# Patient Record
Sex: Female | Born: 2000 | Race: Black or African American | Hispanic: No | Marital: Single | Smoking: Current every day smoker
Health system: Southern US, Community
[De-identification: ages and names within clinical notes are randomized; demographics above are authoritative.]

---
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
## 2021-05-12 ENCOUNTER — Emergency Department: Payer: BC Managed Care – PPO

## 2021-05-12 ENCOUNTER — Emergency Department
Admission: EM | Admit: 2021-05-12 | Discharge: 2021-05-12 | Disposition: A | Discharge status: Home / Self Care | Payer: BC Managed Care – PPO | Attending: Emergency Medicine | Admitting: Emergency Medicine

## 2021-05-12 ENCOUNTER — Other Ambulatory Visit: Payer: Self-pay

## 2021-05-12 ENCOUNTER — Encounter: Payer: Self-pay | Admitting: Emergency Medicine

## 2021-05-12 DIAGNOSIS — N390 Urinary tract infection, site not specified: Secondary | ICD-10-CM | POA: Diagnosis not present

## 2021-05-12 DIAGNOSIS — R109 Unspecified abdominal pain: Secondary | ICD-10-CM | POA: Diagnosis present

## 2021-05-12 DIAGNOSIS — U071 COVID-19: Secondary | ICD-10-CM | POA: Diagnosis not present

## 2021-05-12 LAB — COMPREHENSIVE METABOLIC PANEL
ALT: 18 U/L (ref 0–44)
AST: 32 U/L (ref 15–41)
Albumin: 3.7 g/dL (ref 3.5–5.0)
Alkaline Phosphatase: 45 U/L (ref 38–126)
Anion gap: 8 (ref 5–15)
BUN: 7 mg/dL (ref 6–20)
CO2: 25 mmol/L (ref 22–32)
Calcium: 8.8 mg/dL — ABNORMAL LOW (ref 8.9–10.3)
Chloride: 103 mmol/L (ref 98–111)
Creatinine, Ser: 0.79 mg/dL (ref 0.44–1.00)
GFR, Estimated: >60 mL/min (ref >60)
Glucose, Bld: 96 mg/dL (ref 70–99)
Potassium: 4 mmol/L (ref 3.5–5.1)
Sodium: 136 mmol/L (ref 135–145)
Total Bilirubin: 1.1 mg/dL (ref 0.3–1.2)
Total Protein: 7.7 g/dL (ref 6.5–8.1)

## 2021-05-12 LAB — CBC
HCT: 38.5 % (ref 36.0–46.0)
Hemoglobin: 12.8 g/dL (ref 12.0–15.0)
MCH: 27.4 pg (ref 26.0–34.0)
MCHC: 33.2 g/dL (ref 30.0–36.0)
MCV: 82.4 fL (ref 80.0–100.0)
Platelets: 244 10*3/uL (ref 150–400)
RBC: 4.67 MIL/uL (ref 3.87–5.11)
RDW: 12.2 % (ref 11.5–15.5)
WBC: 6.7 10*3/uL (ref 4.0–10.5)
nRBC: 0 % (ref 0.0–0.2)

## 2021-05-12 LAB — URINALYSIS, COMPLETE (UACMP) WITH MICROSCOPIC
Bilirubin Urine: NEGATIVE
Glucose, UA: NEGATIVE mg/dL
Ketones, ur: 20 mg/dL — AB
Nitrite: NEGATIVE
Protein, ur: NEGATIVE mg/dL
Specific Gravity, Urine: 1.01 (ref 1.005–1.030)
pH: 6 (ref 5.0–8.0)

## 2021-05-12 LAB — LIPASE, BLOOD: Lipase: 23 U/L (ref 11–51)

## 2021-05-12 LAB — PREGNANCY, URINE: Preg Test, Ur: NEGATIVE

## 2021-05-12 MED ORDER — KETOROLAC TROMETHAMINE 30 MG/ML IJ SOLN
30.0000 mg | Freq: Once | INTRAMUSCULAR | Status: AC
  Administered 2021-05-12: 30 mg via INTRAVENOUS
  Filled 2021-05-12: qty 1

## 2021-05-12 MED ORDER — ONDANSETRON HCL 4 MG/2ML IJ SOLN
4.0000 mg | Freq: Once | INTRAMUSCULAR | Status: AC
  Administered 2021-05-12: 4 mg via INTRAVENOUS
  Filled 2021-05-12: qty 2

## 2021-05-12 MED ORDER — ONDANSETRON 4 MG PO TBDP: 4.0000 mg | ORAL_TABLET | Freq: Three times a day (TID) | ORAL | 0 refills | Status: DC | PRN

## 2021-05-12 MED ORDER — CEPHALEXIN 500 MG PO CAPS: 500.0000 mg | ORAL_CAPSULE | Freq: Three times a day (TID) | ORAL | 0 refills | Status: DC

## 2021-05-12 MED ORDER — CEPHALEXIN 500 MG PO CAPS
500.0000 mg | ORAL_CAPSULE | Freq: Once | ORAL | Status: AC
  Administered 2021-05-12: 500 mg via ORAL
  Filled 2021-05-12: qty 1

## 2021-05-12 MED ORDER — SODIUM CHLORIDE 0.9 % IV BOLUS
1000.0000 mL | Freq: Once | INTRAVENOUS | Status: AC
  Administered 2021-05-12: 1000 mL via INTRAVENOUS

## 2021-05-12 NOTE — ED Notes (Signed)
Pt assisted to the bathroom at this time.

## 2021-05-12 NOTE — ED Provider Notes (Signed)
[facility] Emergency Department Provider Note  Time seen: 9:29 AM  I have reviewed the triage vital signs and the nursing notes.   HISTORY  Chief Complaint Abdominal Pain   HPI Kelsey Moran is a 20 y.o. female with no significant past medical history presents to the emergency department for abdominal pain.  According to the patient she was diagnosed with COVID 3 days ago on Saturday.  States she has been having vomiting, cough congestion now over the past 2 days has been experiencing sharp right-sided abdominal pain.  States the pain is worse with any movement sneezing or coughing.   History reviewed. No pertinent past medical history.  There are no problems to display for this patient.   History reviewed. No pertinent surgical history.  Prior to Admission medications   Not on File    Allergies  Allergen Reactions   Shellfish Allergy Anaphylaxis    No family history on file.  Social History    Review of Systems Constitutional: Intermittent low-grade fever Cardiovascular: Negative for chest pain. Respiratory: Negative for shortness of breath.  Positive for cough. Gastrointestinal: Right flank pain.  Positive for nausea and vomiting. Genitourinary: Negative for urinary compaints Musculoskeletal: Negative for musculoskeletal complaints Neurological: Negative for headache All other ROS negative  ____________________________________________   PHYSICAL EXAM:  VITAL SIGNS: ED Triage Vitals  Enc Vitals Group     BP 05/12/21 0906 135/70     Pulse Rate 05/12/21 0906 90     Resp 05/12/21 0906 16     Temp 05/12/21 0906 99 F (37.2 C)     Temp Source 05/12/21 0906 Oral     SpO2 05/12/21 0906 100 %     Weight 05/12/21 0856 188 lb 0.8 oz (85.3 kg)     Height 05/12/21 0856 5\' 4"  (1.626 m)     Head Circumference --      Peak Flow --      Pain Score 05/12/21 0856 8     Pain Loc --      Pain Edu? --      Excl. in GC? --     Constitutional: Alert and oriented. Well appearing and in no distress. Eyes: Normal exam ENT      Head: Normocephalic and atraumatic.      Mouth/Throat: Mucous membranes are moist. Cardiovascular: Normal rate, regular rhythm.  Respiratory: Normal respiratory effort without tachypnea nor retractions. Breath sounds are clear  Gastrointestinal: Soft, mild right mid abdominal tenderness to palpation without rebound guarding or distention.  Moderate right CVA tenderness. Musculoskeletal: Nontender with normal range of motion in all extremities.  Neurologic:  Normal speech and language. No gross focal neurologic deficits  Skin:  Skin is warm, dry and intact.  Psychiatric: Mood and affect are normal.   ____________________________________________   RADIOLOGY  CT is negative for acute abnormality.  ____________________________________________   INITIAL IMPRESSION / ASSESSMENT AND PLAN / ED COURSE  Pertinent labs & imaging results that were available during my care of the patient were reviewed by me and considered in my medical decision making (see chart for details).   Patient presents emergency department for right flank pain nausea vomiting cough diagnosed with COVID 3 days ago.  Overall patient appears well, reassuring physical exam, reassuring vitals.  We will treat with Toradol Zofran fluids.  We will check labs and proceed with a CT renal scan to further evaluate.  Patient agreeable to plan of care.  CT scans negative for acute abnormality.  Urinalysis  does show a large amount of leukocytes with rare bacteria.  We will cover with Keflex as a precaution as well as Zofran and have the patient follow-up with her doctor.  Patient agreeable with plan of care.  Kelsey Moran was evaluated in Emergency Department on 05/12/2021 for the symptoms described in the history of present illness. She was evaluated in the context of the global COVID-19 pandemic, which necessitated consideration  that the patient might be at risk for infection with the SARS-CoV-2 virus that causes COVID-19. Institutional protocols and algorithms that pertain to the evaluation of patients at risk for COVID-19 are in a state of rapid change based on information released by regulatory bodies including the CDC and federal and state organizations. These policies and algorithms were followed during the patient's care in the ED.  ____________________________________________   FINAL CLINICAL IMPRESSION(S) / ED DIAGNOSES  Urinary tract infection COVID   Minna Antis, MD 05/12/21 1325

## 2021-05-12 NOTE — ED Notes (Signed)
Pt stating cannot provided urine sample at this time but states she is not pregnant

## 2021-05-12 NOTE — ED Triage Notes (Signed)
C/O RLQ abdominal pain  since Sunday.  Also vomiting.  Tested COVID + on Sunday.

## 2022-05-12 IMAGING — CT CT RENAL STONE PROTOCOL
2 of 4 series · 17 of 46 positions shown, 19 images · non-contrast
Comparison: None.

CLINICAL DATA: Right flank pain, right lower quadrant abdominal
pain, kidney stones suspected, COVID positive

EXAM:
CT ABDOMEN AND PELVIS WITHOUT CONTRAST
TECHNIQUE: Multidetector CT imaging of the abdomen and pelvis was performed
following the standard protocol without IV contrast.

[Series 2: stone full standard · axial · 0.67mm/px · z∈[-374,+66]mm · 14 of 96 slices shown, 16 images]
[im 4/96  soft-tissue]
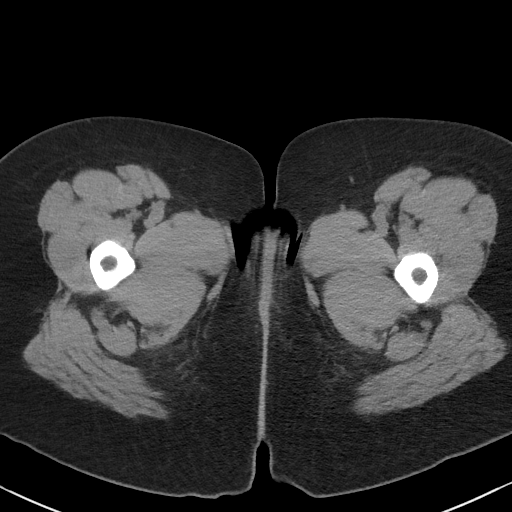
[im 4/96  bone]
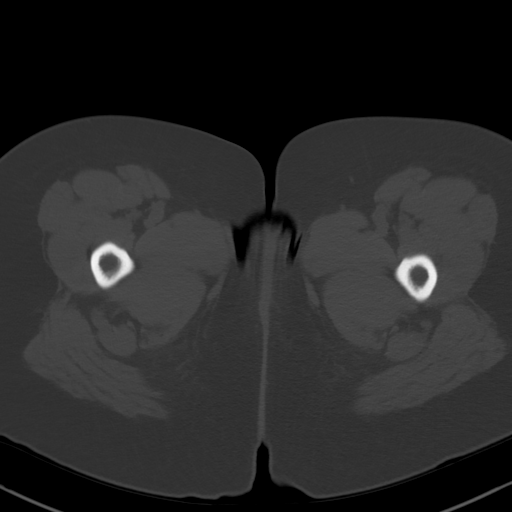
[im 12/96  soft-tissue]
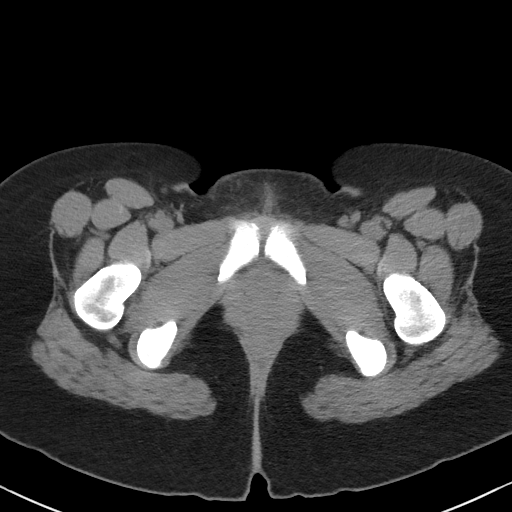
[im 20/96  soft-tissue]
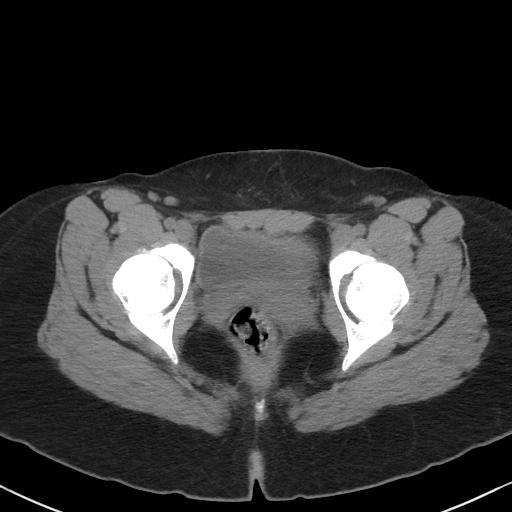
[im 27/96  soft-tissue]
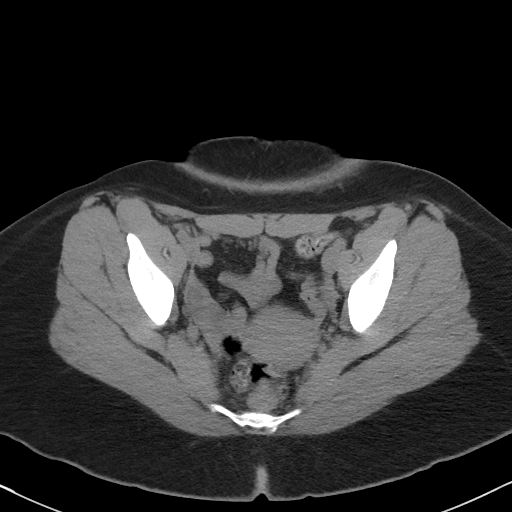
[im 31/96  soft-tissue]
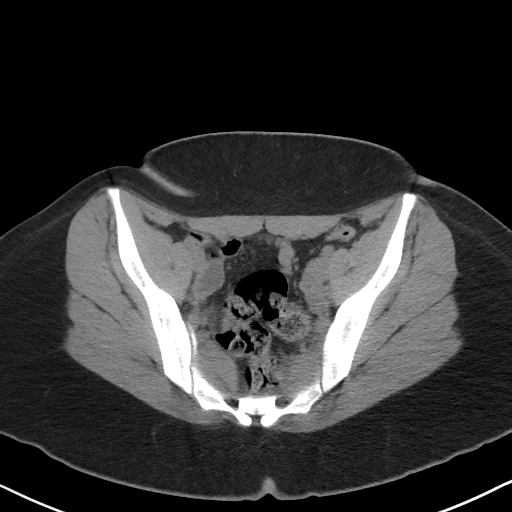
[im 39/96  soft-tissue]
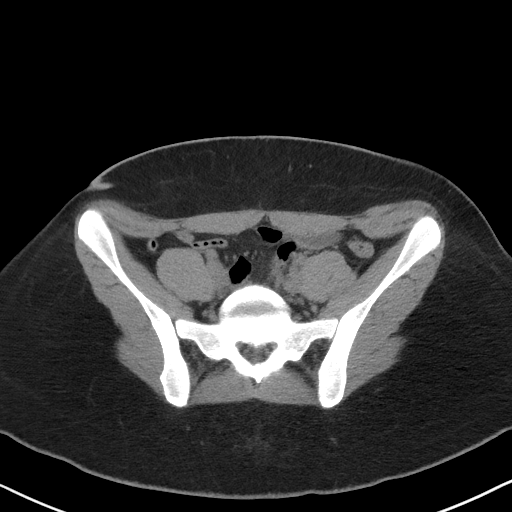
[im 46/96  soft-tissue]
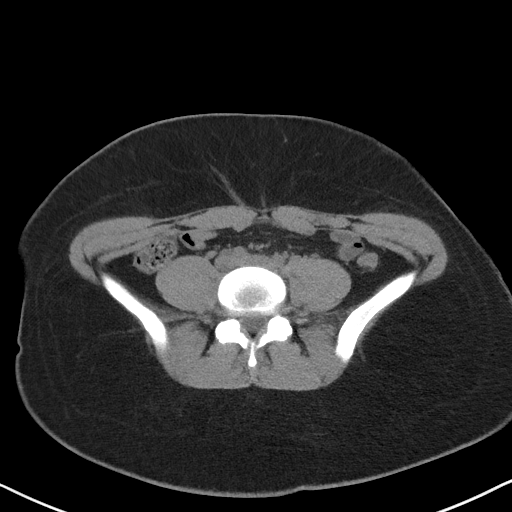
[im 50/96  soft-tissue]
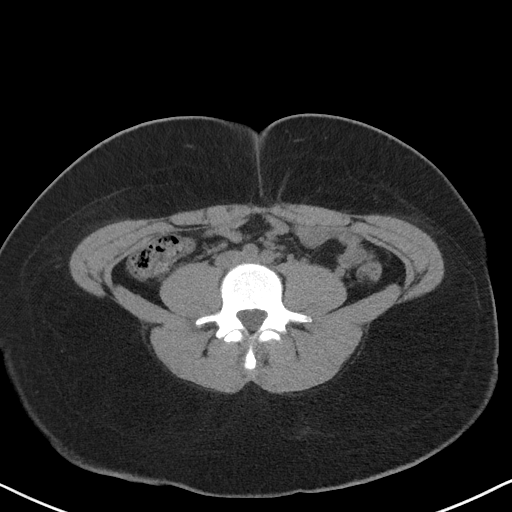
[im 58/96  soft-tissue]
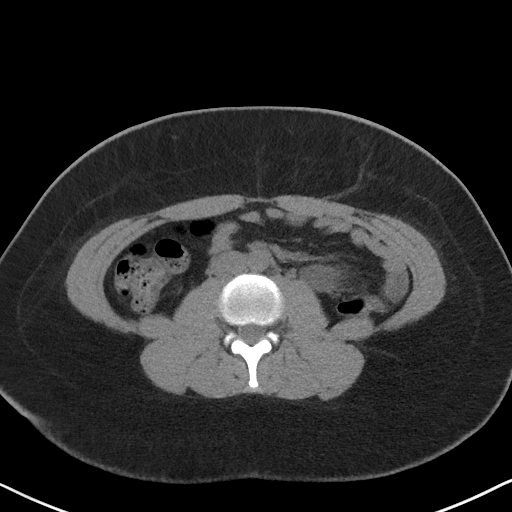
[im 58/96  bone]
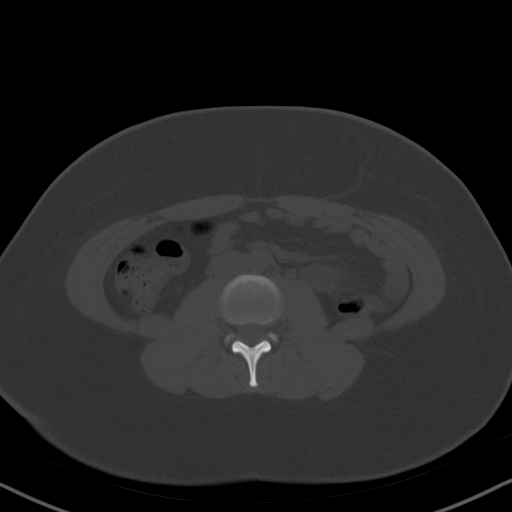
[im 65/96  soft-tissue]
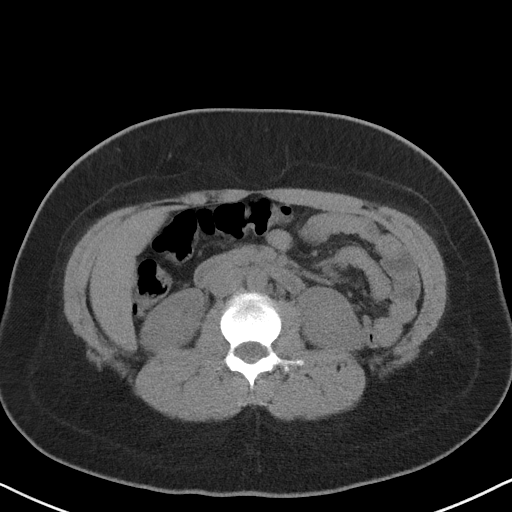
[im 73/96  soft-tissue]
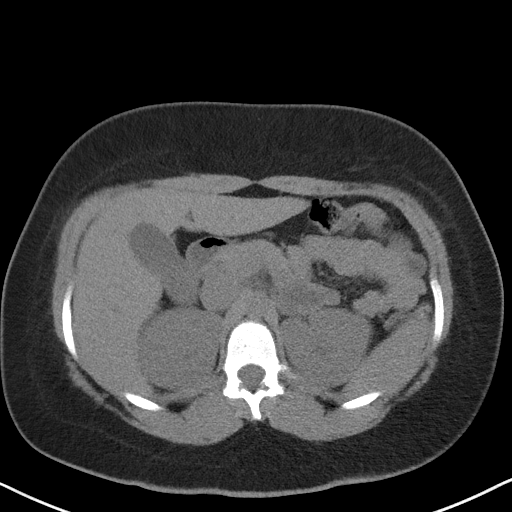
[im 77/96  soft-tissue]
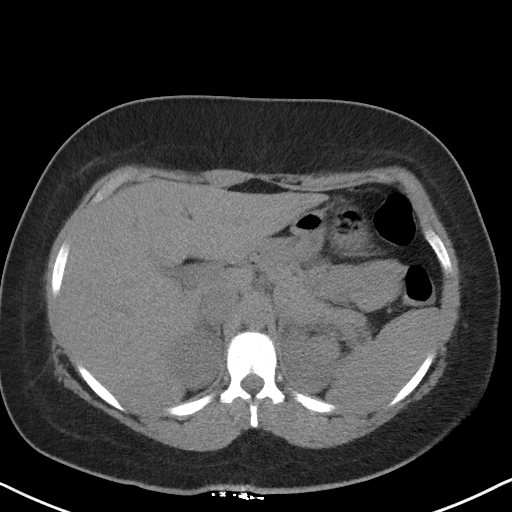
[im 84/96  soft-tissue]
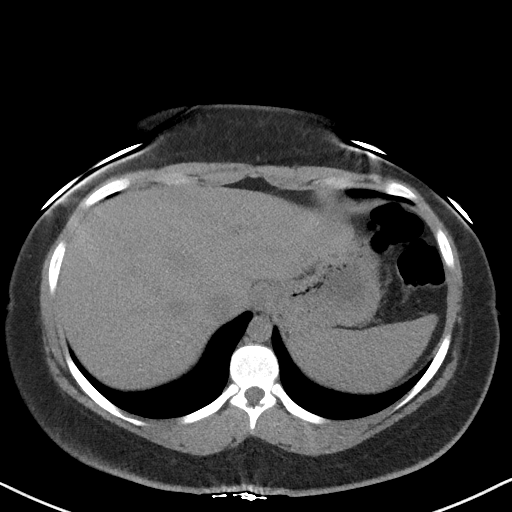
[im 92/96  soft-tissue]
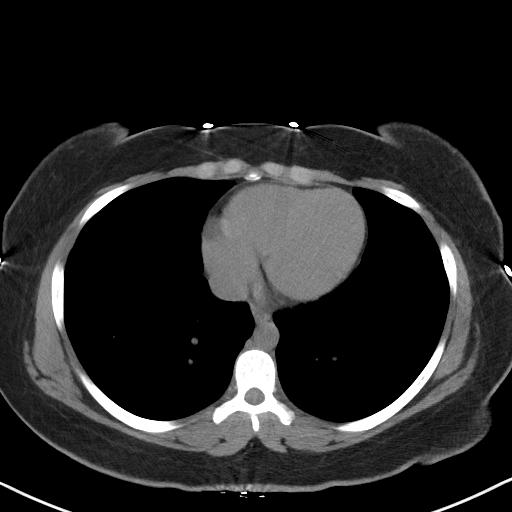

[Series 5: coronal · coronal · 0.90mm/px · 3 of 138 slices shown]
[im 46/138  soft-tissue]
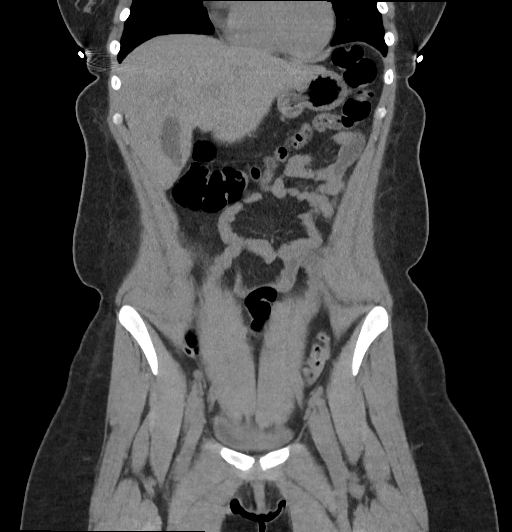
[im 61/138  soft-tissue]
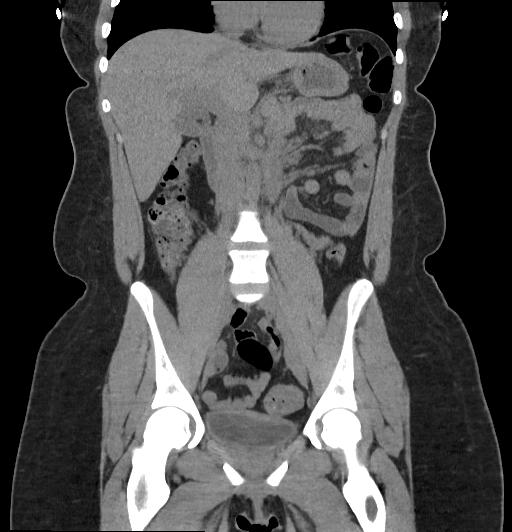
[im 77/138  soft-tissue]
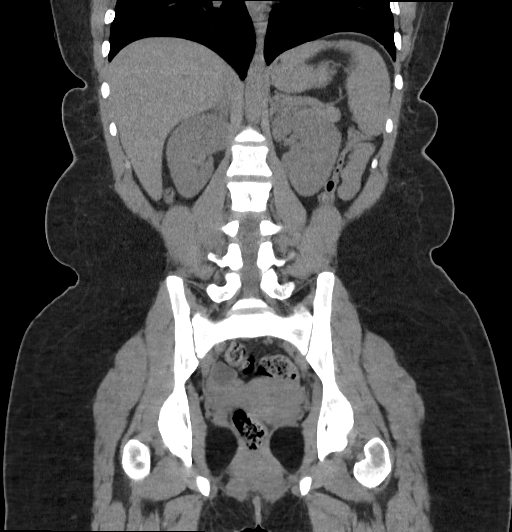

[17 of 46 positions shown; findings below may reference images not displayed]

FINDINGS: Lower chest: No acute abnormality.

Hepatobiliary: No solid liver abnormality is seen. No gallstones,
gallbladder wall thickening, or biliary dilatation.

Pancreas: Unremarkable. No pancreatic ductal dilatation or
surrounding inflammatory changes.

Spleen: Normal in size without significant abnormality.

Adrenals/Urinary Tract: Adrenal glands are unremarkable. Kidneys are
normal, without renal calculi, solid lesion, or hydronephrosis.
Bladder is unremarkable.

Stomach/Bowel: Stomach is within normal limits. Appendix appears
normal. No evidence of bowel wall thickening, distention, or
inflammatory changes.

Vascular/Lymphatic: No significant vascular findings are present. No
enlarged abdominal or pelvic lymph nodes.

Reproductive: No mass or other significant abnormality. Small cysts
or follicles of the right ovary.

Other: No abdominal wall hernia or abnormality. No abdominopelvic
ascites.

Musculoskeletal: No acute or significant osseous findings.
IMPRESSION: 1. No non-contrast CT findings of the abdomen or pelvis to explain
right flank pain. No evidence of urinary tract calculus or
hydronephrosis.

2.  Normal appendix.

## 2023-04-02 ENCOUNTER — Other Ambulatory Visit: Payer: Self-pay

## 2023-04-02 ENCOUNTER — Emergency Department
Admission: EM | Admit: 2023-04-02 | Discharge: 2023-04-02 | Disposition: A | Discharge status: Home / Self Care | Payer: Commercial Managed Care - PPO | Attending: Emergency Medicine | Admitting: Emergency Medicine

## 2023-04-02 ENCOUNTER — Emergency Department: Payer: Commercial Managed Care - PPO

## 2023-04-02 DIAGNOSIS — R6884 Jaw pain: Secondary | ICD-10-CM | POA: Diagnosis not present

## 2023-04-02 DIAGNOSIS — R21 Rash and other nonspecific skin eruption: Secondary | ICD-10-CM | POA: Insufficient documentation

## 2023-04-02 DIAGNOSIS — R599 Enlarged lymph nodes, unspecified: Secondary | ICD-10-CM | POA: Insufficient documentation

## 2023-04-02 LAB — URINALYSIS, ROUTINE W REFLEX MICROSCOPIC
Bilirubin Urine: NEGATIVE
Glucose, UA: NEGATIVE mg/dL
Hgb urine dipstick: NEGATIVE
Ketones, ur: NEGATIVE mg/dL
Leukocytes,Ua: NEGATIVE
Nitrite: NEGATIVE
Protein, ur: NEGATIVE mg/dL
Specific Gravity, Urine: 1.011 (ref 1.005–1.030)
pH: 6 (ref 5.0–8.0)

## 2023-04-02 LAB — COMPREHENSIVE METABOLIC PANEL
ALT: 15 U/L (ref 0–44)
AST: 26 U/L (ref 15–41)
Albumin: 4.1 g/dL (ref 3.5–5.0)
Alkaline Phosphatase: 57 U/L (ref 38–126)
Anion gap: 6 (ref 5–15)
BUN: 6 mg/dL (ref 6–20)
CO2: 24 mmol/L (ref 22–32)
Calcium: 9.1 mg/dL (ref 8.9–10.3)
Chloride: 107 mmol/L (ref 98–111)
Creatinine, Ser: 0.7 mg/dL (ref 0.44–1.00)
GFR, Estimated: >60 mL/min (ref >60)
Glucose, Bld: 88 mg/dL (ref 70–99)
Potassium: 3.9 mmol/L (ref 3.5–5.1)
Sodium: 137 mmol/L (ref 135–145)
Total Bilirubin: 0.6 mg/dL (ref 0.3–1.2)
Total Protein: 8.7 g/dL — ABNORMAL HIGH (ref 6.5–8.1)

## 2023-04-02 LAB — CBC WITH DIFFERENTIAL/PLATELET
Abs Immature Granulocytes: 0.01 10*3/uL (ref 0.00–0.07)
Basophils Absolute: 0 10*3/uL (ref 0.0–0.1)
Basophils Relative: 0 %
Eosinophils Absolute: 0.3 10*3/uL (ref 0.0–0.5)
Eosinophils Relative: 4 %
HCT: 39.1 % (ref 36.0–46.0)
Hemoglobin: 12.4 g/dL (ref 12.0–15.0)
Immature Granulocytes: 0 %
Lymphocytes Relative: 38 %
Lymphs Abs: 2.8 10*3/uL (ref 0.7–4.0)
MCH: 26 pg (ref 26.0–34.0)
MCHC: 31.7 g/dL (ref 30.0–36.0)
MCV: 82 fL (ref 80.0–100.0)
Monocytes Absolute: 0.6 10*3/uL (ref 0.1–1.0)
Monocytes Relative: 8 %
Neutro Abs: 3.6 10*3/uL (ref 1.7–7.7)
Neutrophils Relative %: 50 %
Platelets: 303 10*3/uL (ref 150–400)
RBC: 4.77 MIL/uL (ref 3.87–5.11)
RDW: 12.7 % (ref 11.5–15.5)
WBC: 7.3 10*3/uL (ref 4.0–10.5)
nRBC: 0 % (ref 0.0–0.2)

## 2023-04-02 LAB — PREGNANCY, URINE: Preg Test, Ur: NEGATIVE

## 2023-04-02 MED ORDER — DIPHENHYDRAMINE HCL 25 MG PO CAPS
25.0000 mg | ORAL_CAPSULE | Freq: Once | ORAL | Status: AC
  Administered 2023-04-02: 25 mg via ORAL
  Filled 2023-04-02: qty 1

## 2023-04-02 MED ORDER — DEXAMETHASONE SODIUM PHOSPHATE 10 MG/ML IJ SOLN
10.0000 mg | Freq: Once | INTRAMUSCULAR | Status: AC
  Administered 2023-04-02: 10 mg via INTRAMUSCULAR
  Filled 2023-04-02: qty 1

## 2023-04-02 NOTE — ED Provider Notes (Signed)
[facility] Provider Note    Event Date/Time   First MD Initiated Contact with Patient 04/02/23 1910     (approximate)   History   Dental Pain   HPI  Kelsey Moran is a 22 y.o. female with no PMH who presents with bilateral lower jaw pain and knots behind both the ears and on the back of her scalp, as well as a rash.  Patient first noticed the knots about a week ago.  She states that they are not painful.  Her rash began earlier today and is not itchy.  Patient denies any fevers, sore throat, cough, fatigue, weight loss, sick exposures, animal bites, tick bites, STI exposures, urinary symptoms.  She has not traveled anywhere recently.     Physical Exam   Triage Vital Signs: ED Triage Vitals  Enc Vitals Group     BP 04/02/23 1655 131/85     Pulse Rate 04/02/23 1655 (!) 57     Resp 04/02/23 1655 18     Temp 04/02/23 1655 98.1 F (36.7 C)     Temp Source 04/02/23 1655 Oral     SpO2 04/02/23 1655 96 %     Weight 04/02/23 1656 208 lb (94.3 kg)     Height 04/02/23 1656 5\' 4"  (1.626 m)     Head Circumference --      Peak Flow --      Pain Score 04/02/23 1700 3     Pain Loc --      Pain Edu? --      Excl. in GC? --     Most recent vital signs: Vitals:   04/02/23 2045 04/02/23 2204  BP: 121/73 125/86  Pulse: 79 85  Resp: 18 18  Temp: 98.7 F (37.1 C) 98.2 F (36.8 C)  SpO2: 100% 100%    General: Awake, no distress.  CV:  Good peripheral perfusion.  RRR, no murmurs rubs or gallops. Resp:  Normal effort.  CTAB. Abd:  No distention.  Macular rash across abdomen and back. Other:  Moist oral mucosa, no pharyngeal erythema, no tonsillar enlargement or erythema, palpable nontender posterior auricular, occipital and submandibular lymph nodes, EACs clear, TMs translucent.   ED Results / Procedures / Treatments   Labs (all labs ordered are listed, but only abnormal results are displayed) Labs Reviewed  COMPREHENSIVE METABOLIC PANEL -  Abnormal; Notable for the following components:      Result Value   Total Protein 8.7 (*)    All other components within normal limits  URINALYSIS, ROUTINE W REFLEX MICROSCOPIC - Abnormal; Notable for the following components:   Color, Urine YELLOW (*)    APPearance CLEAR (*)    All other components within normal limits  CBC WITH DIFFERENTIAL/PLATELET  PREGNANCY, URINE     RADIOLOGY  Chest x-ray obtained in the ED today.  I independently interpreted the images as well as reviewed the radiologist report.   PROCEDURES:  Critical Care performed: No  Procedures   MEDICATIONS ORDERED IN ED: Medications  diphenhydrAMINE (BENADRYL) capsule 25 mg (25 mg Oral Given 04/02/23 2100)  dexamethasone (DECADRON) injection 10 mg (10 mg Intramuscular Given 04/02/23 2159)     IMPRESSION / MDM / ASSESSMENT AND PLAN / ED COURSE  I reviewed the triage vital signs and the nursing notes.                              Differential diagnosis  includes, but is not limited to, TMJ pain, pulpitis, mastoiditis, pharyngitis, lymphoma, mono.  Patient's presentation is most consistent with acute complicated illness / injury requiring diagnostic workup.  Patient presented to the ED for evaluation of a knot under her ear that has been present for about a week.  She also had a macular rash that began today.  I gave her some Benadryl in an attempt to clear this.  This did not work so I gave her some dexamethasone before discharge.  On physical exam it became clear that the knots were swollen lymph nodes. Patient had stable vitals and was asymptomatic aside from the enlarged lymph nodes. I took an extensive history and was unable to identify any infectious reasons as to why the patient would have enlarged lymph nodes.  As a result of this I was concerned about lymphoma so I got basic blood work which was normal.  I also got a UA to look for a potential infectious source, which came back clean.  I got a pregnancy test  as patient had concern she may be pregnant, which was also negative.  Chest x-ray was obtained to look for a lung mass.  I independently reviewed the images as well as the radiologist report which was negative for any acute pathology as well as a mass.  I explained all of the results to the patient and her mother.  Given the negative workup and patients stable vital signs I believe she is appropriate for outpatient management.  I advised her to follow-up with her PCP in a couple weeks if she still noticing the enlarged lymph nodes.  I explained that if her lymph nodes remain enlarged she should have further workup but based on my evaluation so far there is nothing emergent that needs to be done at this point.  I also explained that she can see her PCP if the rash does not go away.  Patient and mother were relieved that there was nothing acute going on.  All questions were answered, patient was stable at discharge.     FINAL CLINICAL IMPRESSION(S) / ED DIAGNOSES   Final diagnoses:  Lymph node enlargement     Rx / DC Orders   ED Discharge Orders     None        Note:  This document was prepared using Dragon voice recognition software and may include unintentional dictation errors.   Cameron Ali, PA-C 04/03/23 0036    Sharman Cheek, MD 04/04/23 [phone]

## 2023-04-02 NOTE — ED Triage Notes (Signed)
Pt presents via POV c/o bilateral lower jaw pain and "knot" under ear. Reports has been going on for a week. Denies fever. Reports feels like she feels throbbing in left ear.

## 2023-04-04 ENCOUNTER — Encounter: Payer: Commercial Managed Care - PPO | Admitting: Internal Medicine

## 2023-04-13 ENCOUNTER — Encounter: Payer: Self-pay | Admitting: Internal Medicine

## 2023-04-13 ENCOUNTER — Ambulatory Visit (INDEPENDENT_AMBULATORY_CARE_PROVIDER_SITE_OTHER): Payer: Commercial Managed Care - PPO | Admitting: Internal Medicine

## 2023-04-13 VITALS — BP 132/86 | HR 76 | Ht 64.0 in | Wt 209.4 lb

## 2023-04-13 DIAGNOSIS — R590 Localized enlarged lymph nodes: Secondary | ICD-10-CM

## 2023-04-13 DIAGNOSIS — E669 Obesity, unspecified: Secondary | ICD-10-CM | POA: Insufficient documentation

## 2023-04-13 DIAGNOSIS — Z6832 Body mass index (BMI) 32.0-32.9, adult: Secondary | ICD-10-CM | POA: Diagnosis not present

## 2023-04-13 DIAGNOSIS — E6609 Other obesity due to excess calories: Secondary | ICD-10-CM | POA: Diagnosis not present

## 2023-04-13 NOTE — Progress Notes (Signed)
Established Patient Office Visit  Subjective:  Patient ID: Kelsey Moran, female    DOB: 2001/07/28  Age: 22 y.o. MRN: [id number]  Chief Complaint  Patient presents with   Hospitalization Follow-up    Hospital follow up    ER followup. Rash has resolved but lymph gland enlargement persists.     No other concerns at this time.   No past medical history on file.  No past surgical history on file.  Social History   Socioeconomic History   Marital status: Single    Spouse name: Not on file   Number of children: Not on file   Years of education: Not on file   Highest education level: Not on file  Occupational History   Not on file  Tobacco Use   Smoking status: Every Day    Types: Cigarettes   Smokeless tobacco: Never  Substance and Sexual Activity   Alcohol use: Not on file   Drug use: Not on file   Sexual activity: Not on file  Other Topics Concern   Not on file  Social History Narrative   Not on file   Social Determinants of Health   Financial Resource Strain: Not on file  Food Insecurity: Not on file  Transportation Needs: Not on file  Physical Activity: Not on file  Stress: Not on file  Social Connections: Not on file  Intimate Partner Violence: Not on file    No family history on file.  Allergies  Allergen Reactions   Shellfish Allergy Anaphylaxis    Review of Systems  Constitutional:  Negative for weight loss.       No night sweats  All other systems reviewed and are negative.      Objective:   BP 132/86   Pulse 76   Ht 5\' 4"  (1.626 m)   Wt 209 lb 6.4 oz (95 kg)   SpO2 100%   BMI 35.94 kg/m   Vitals:   04/13/23 1054  BP: 132/86  Pulse: 76  Height: 5\' 4"  (1.626 m)  Weight: 209 lb 6.4 oz (95 kg)  SpO2: 100%  BMI (Calculated): 35.93    Physical Exam Vitals reviewed.  Constitutional:      General: She is not in acute distress.    Appearance: She is obese.  HENT:     Head: Normocephalic.     Nose: Nose normal.      Mouth/Throat:     Mouth: Mucous membranes are moist.  Eyes:     Extraocular Movements: Extraocular movements intact.     Pupils: Pupils are equal, round, and reactive to light.  Cardiovascular:     Rate and Rhythm: Normal rate and regular rhythm.     Heart sounds: No murmur heard. Pulmonary:     Effort: Pulmonary effort is normal.     Breath sounds: No rhonchi or rales.  Abdominal:     General: Abdomen is flat.     Palpations: There is no hepatomegaly, splenomegaly or mass.  Musculoskeletal:        General: Normal range of motion.     Cervical back: Normal range of motion. No tenderness.  Skin:    General: Skin is warm and dry.  Neurological:     General: No focal deficit present.     Mental Status: She is alert and oriented to person, place, and time.     Cranial Nerves: No cranial nerve deficit.     Motor: No weakness.  Psychiatric:  Mood and Affect: Mood normal.        Behavior: Behavior normal.      No results found for any visits on 04/13/23.  Recent Results (from the past 2160 hour(Nyiah Pianka))  Comprehensive metabolic panel     Status: Abnormal   Collection Time: 04/02/23  8:30 PM  Result Value Ref Range   Sodium 137 135 - 145 mmol/L   Potassium 3.9 3.5 - 5.1 mmol/L   Chloride 107 98 - 111 mmol/L   CO2 24 22 - 32 mmol/L   Glucose, Bld 88 70 - 99 mg/dL    Comment: Glucose reference range applies only to samples taken after fasting for at least 8 hours.   BUN 6 6 - 20 mg/dL   Creatinine, Ser 4.09 0.44 - 1.00 mg/dL   Calcium 9.1 8.9 - 81.1 mg/dL   Total Protein 8.7 (H) 6.5 - 8.1 g/dL   Albumin 4.1 3.5 - 5.0 g/dL   AST 26 15 - 41 U/L   ALT 15 0 - 44 U/L   Alkaline Phosphatase 57 38 - 126 U/L   Total Bilirubin 0.6 0.3 - 1.2 mg/dL   GFR, Estimated >91 >47 mL/min    Comment: (NOTE) Calculated using the CKD-EPI Creatinine Equation (2021)    Anion gap 6 5 - 15    Comment: Performed at [facility], [address] [place], [place] [address]  CBC with  Differential     Status: None   Collection Time: 04/02/23  8:30 PM  Result Value Ref Range   WBC 7.3 4.0 - 10.5 K/uL   RBC 4.77 3.87 - 5.11 MIL/uL   Hemoglobin 12.4 12.0 - 15.0 g/dL   HCT 21.3 08.6 - 57.8 %   MCV 82.0 80.0 - 100.0 fL   MCH 26.0 26.0 - 34.0 pg   MCHC 31.7 30.0 - 36.0 g/dL   RDW 46.9 62.9 - 52.8 %   Platelets 303 150 - 400 K/uL   nRBC 0.0 0.0 - 0.2 %   Neutrophils Relative % 50 %   Neutro Abs 3.6 1.7 - 7.7 K/uL   Lymphocytes Relative 38 %   Lymphs Abs 2.8 0.7 - 4.0 K/uL   Monocytes Relative 8 %   Monocytes Absolute 0.6 0.1 - 1.0 K/uL   Eosinophils Relative 4 %   Eosinophils Absolute 0.3 0.0 - 0.5 K/uL   Basophils Relative 0 %   Basophils Absolute 0.0 0.0 - 0.1 K/uL   Immature Granulocytes 0 %   Abs Immature Granulocytes 0.01 0.00 - 0.07 K/uL    Comment: Performed at [facility], [address] [place], [place] [address]  Pregnancy, urine     Status: None   Collection Time: 04/02/23  8:30 PM  Result Value Ref Range   Preg Test, Ur NEGATIVE NEGATIVE    Comment: Performed at [facility], [address] [place], [place] [address]  Urinalysis, Routine w reflex microscopic -Urine, Clean Catch     Status: Abnormal   Collection Time: 04/02/23  8:30 PM  Result Value Ref Range   Color, Urine YELLOW (A) YELLOW   APPearance CLEAR (A) CLEAR   Specific Gravity, Urine 1.011 1.005 - 1.030   pH 6.0 5.0 - 8.0   Glucose, UA NEGATIVE NEGATIVE mg/dL   Hgb urine dipstick NEGATIVE NEGATIVE   Bilirubin Urine NEGATIVE NEGATIVE   Ketones, ur NEGATIVE NEGATIVE mg/dL   Protein, ur NEGATIVE NEGATIVE mg/dL   Nitrite NEGATIVE NEGATIVE   Leukocytes,Ua NEGATIVE NEGATIVE  Comment: Performed at [facility], [address] [place], [place] [address]      Assessment & Plan:  As per problem list. Will discuss referral for biopsy at next visit.  Problem List Items Addressed This Visit   None Visit Diagnoses     Lymphadenopathy of head and neck  region    -  Primary   Relevant Orders   CT HEAD W CONTRAST ( )   Sed Rate (ESR)   LDH       Return in about 10 days (around 04/23/2023) for lab results.   Total time spent: 30 minutes  Luna Fuse, MD  04/13/2023   This document may have been prepared by [facility] Voice Recognition software and as such may include unintentional dictation errors.

## 2023-04-14 LAB — HIV ANTIBODY (ROUTINE TESTING W REFLEX): HIV Screen 4th Generation wRfx: NONREACTIVE

## 2023-04-14 LAB — SEDIMENTATION RATE: Sed Rate: 41 mm/hr — ABNORMAL HIGH (ref 0–32)

## 2023-04-14 LAB — LACTATE DEHYDROGENASE: LDH: 160 IU/L (ref 119–226)

## 2023-04-18 NOTE — Addendum Note (Signed)
Addended by: Aundria Mems AHMAD on: 04/18/2023 10:22 AM   Modules accepted: Orders

## 2023-04-19 ENCOUNTER — Ambulatory Visit (INDEPENDENT_AMBULATORY_CARE_PROVIDER_SITE_OTHER): Payer: Commercial Managed Care - PPO

## 2023-04-19 DIAGNOSIS — R590 Localized enlarged lymph nodes: Secondary | ICD-10-CM

## 2023-04-19 MED ORDER — IOHEXOL 300 MG/ML  SOLN
100.0000 mL | Freq: Once | INTRAMUSCULAR | Status: AC | PRN
  Administered 2023-04-19: 100 mL via INTRAVENOUS

## 2023-04-29 ENCOUNTER — Ambulatory Visit (INDEPENDENT_AMBULATORY_CARE_PROVIDER_SITE_OTHER): Payer: Commercial Managed Care - PPO | Admitting: Internal Medicine

## 2023-04-29 VITALS — BP 121/75 | HR 66 | Ht 64.0 in | Wt 205.8 lb

## 2023-04-29 DIAGNOSIS — Z6832 Body mass index (BMI) 32.0-32.9, adult: Secondary | ICD-10-CM

## 2023-04-29 DIAGNOSIS — E782 Mixed hyperlipidemia: Secondary | ICD-10-CM | POA: Insufficient documentation

## 2023-04-29 DIAGNOSIS — R590 Localized enlarged lymph nodes: Secondary | ICD-10-CM | POA: Diagnosis not present

## 2023-04-29 DIAGNOSIS — E6609 Other obesity due to excess calories: Secondary | ICD-10-CM | POA: Diagnosis not present

## 2023-04-29 DIAGNOSIS — K118 Other diseases of salivary glands: Secondary | ICD-10-CM | POA: Diagnosis not present

## 2023-04-29 NOTE — Progress Notes (Signed)
Established Patient Office Visit  Subjective:  Patient ID: Kelsey Moran, female    DOB: April 30, 2001  Age: 22 y.o. MRN: [id number]  Chief Complaint  Patient presents with   Follow-up    2 week F/U    C/o discomfort left face when she lies on it. Labs reviewed with patient and only notable for elevated sed rate. 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     No other concerns at this time.   No past medical history on file.  No past surgical history on file.  Social History   Socioeconomic History   Marital status: Single    Spouse name: Not on file   Number of children: Not on file   Years of education: Not on file   Highest education level: Not on file  Occupational History   Not on file  Tobacco Use   Smoking status: Every Day    Types: Cigarettes   Smokeless tobacco: Never  Substance and Sexual Activity   Alcohol use: Not on file   Drug use: Not on file   Sexual activity: Not on file  Other Topics Concern   Not on file  Social History Narrative   Not on file   Social Determinants of Health   Financial Resource Strain: Not on file  Food Insecurity: Not on file  Transportation Needs: Not on file  Physical Activity: Not on file  Stress: Not on file  Social Connections: Not on file  Intimate Partner Violence: Not on file    No family history on file.  Allergies  Allergen Reactions   Shellfish Allergy Anaphylaxis    Review of Systems  Constitutional:  Negative for weight loss.       No night sweats  All other systems reviewed and are negative.      Objective:   BP 121/75   Pulse 66   Ht 5\' 4"  (1.626 m)   Wt 205 lb 12.8 oz (93.4 kg)   LMP 03/30/2023   SpO2 99%   BMI 35.33 kg/m   Vitals:   04/29/23 1554  BP: 121/75  Pulse: 66  Height: 5\' 4"  (1.626 m)  Weight: 205 lb 12.8 oz (93.4 kg)  SpO2: 99%  BMI (Calculated): 35.31    Physical Exam Vitals reviewed.  Constitutional:      General: She is not in acute  distress.    Appearance: She is obese.  HENT:     Head: Normocephalic.     Comments: Non tender left occipital lymphadenopathy    Nose: Nose normal.     Mouth/Throat:     Mouth: Mucous membranes are moist.  Eyes:     Extraocular Movements: Extraocular movements intact.     Pupils: Pupils are equal, round, and reactive to light.  Cardiovascular:     Rate and Rhythm: Normal rate and regular rhythm.     Heart sounds: No murmur heard. Pulmonary:     Effort: Pulmonary effort is normal.     Breath sounds: No rhonchi or rales.  Abdominal:     General: Abdomen is flat.     Palpations: There is no hepatomegaly, splenomegaly or mass.  Musculoskeletal:        General: Normal range of motion.     Cervical back: Normal range of motion. No tenderness.  Skin:    General: Skin is warm and dry.  Neurological:     General: No focal deficit present.     Mental Status: She is alert  and oriented to person, place, and time.     Cranial Nerves: No cranial nerve deficit.     Motor: No weakness.  Psychiatric:        Mood and Affect: Mood normal.        Behavior: Behavior normal.      No results found for any visits on 04/29/23.  Recent Results (from the past 2160 hour(Kinley Ferrentino))  Comprehensive metabolic panel     Status: Abnormal   Collection Time: 04/02/23  8:30 PM  Result Value Ref Range   Sodium 137 135 - 145 mmol/L   Potassium 3.9 3.5 - 5.1 mmol/L   Chloride 107 98 - 111 mmol/L   CO2 24 22 - 32 mmol/L   Glucose, Bld 88 70 - 99 mg/dL    Comment: Glucose reference range applies only to samples taken after fasting for at least 8 hours.   BUN 6 6 - 20 mg/dL   Creatinine, Ser 6.04 0.44 - 1.00 mg/dL   Calcium 9.1 8.9 - 54.0 mg/dL   Total Protein 8.7 (H) 6.5 - 8.1 g/dL   Albumin 4.1 3.5 - 5.0 g/dL   AST 26 15 - 41 U/L   ALT 15 0 - 44 U/L   Alkaline Phosphatase 57 38 - 126 U/L   Total Bilirubin 0.6 0.3 - 1.2 mg/dL   GFR, Estimated >98 >11 mL/min    Comment: (NOTE) Calculated using the CKD-EPI  Creatinine Equation (2021)    Anion gap 6 5 - 15    Comment: Performed at [facility], [address] [place], [place] [address]  CBC with Differential     Status: None   Collection Time: 04/02/23  8:30 PM  Result Value Ref Range   WBC 7.3 4.0 - 10.5 K/uL   RBC 4.77 3.87 - 5.11 MIL/uL   Hemoglobin 12.4 12.0 - 15.0 g/dL   HCT 29.5 62.1 - 30.8 %   MCV 82.0 80.0 - 100.0 fL   MCH 26.0 26.0 - 34.0 pg   MCHC 31.7 30.0 - 36.0 g/dL   RDW 65.7 84.6 - 96.2 %   Platelets 303 150 - 400 K/uL   nRBC 0.0 0.0 - 0.2 %   Neutrophils Relative % 50 %   Neutro Abs 3.6 1.7 - 7.7 K/uL   Lymphocytes Relative 38 %   Lymphs Abs 2.8 0.7 - 4.0 K/uL   Monocytes Relative 8 %   Monocytes Absolute 0.6 0.1 - 1.0 K/uL   Eosinophils Relative 4 %   Eosinophils Absolute 0.3 0.0 - 0.5 K/uL   Basophils Relative 0 %   Basophils Absolute 0.0 0.0 - 0.1 K/uL   Immature Granulocytes 0 %   Abs Immature Granulocytes 0.01 0.00 - 0.07 K/uL    Comment: Performed at [facility], [address] [place], [place] [address]  Pregnancy, urine     Status: None   Collection Time: 04/02/23  8:30 PM  Result Value Ref Range   Preg Test, Ur NEGATIVE NEGATIVE    Comment: Performed at [facility], [address] [place], [place] [address]  Urinalysis, Routine w reflex microscopic -Urine, Clean Catch     Status: Abnormal   Collection Time: 04/02/23  8:30 PM  Result Value Ref Range   Color, Urine YELLOW (A) YELLOW   APPearance CLEAR (A) CLEAR   Specific Gravity, Urine 1.011 1.005 - 1.030   pH 6.0 5.0 - 8.0   Glucose, UA NEGATIVE NEGATIVE mg/dL   Hgb urine dipstick NEGATIVE  NEGATIVE   Bilirubin Urine NEGATIVE NEGATIVE   Ketones, ur NEGATIVE NEGATIVE mg/dL   Protein, ur NEGATIVE NEGATIVE mg/dL   Nitrite NEGATIVE NEGATIVE   Leukocytes,Ua NEGATIVE NEGATIVE    Comment: Performed at [facility], [address] [place], [place] [address]  Sed Rate (ESR)     Status: Abnormal   Collection  Time: 04/13/23 11:27 AM  Result Value Ref Range   Sed Rate 41 (H) 0 - 32 mm/hr  LDH     Status: None   Collection Time: 04/13/23 11:27 AM  Result Value Ref Range   LDH 160 119 - 226 IU/L  HIV antibody (with reflex)     Status: None   Collection Time: 04/13/23 11:38 AM  Result Value Ref Range   HIV Screen 4th Generation wRfx Non Reactive Non Reactive    Comment: HIV-1/HIV-2 antibodies and HIV-1 p24 antigen were NOT detected. There is no laboratory evidence of HIV infection. HIV Negative       Assessment & Plan:   As per problem list  Problem List Items Addressed This Visit       Immune and Lymphatic   Lymphadenopathy of head and neck region     Other   Parotid mass - Primary   Relevant Orders   Ambulatory referral to ENT    Return if symptoms worsen or fail to improve.   Total time spent: 20 minutes  Luna Fuse, MD  04/29/2023   This document may have been prepared by [facility] Voice Recognition software and as such may include unintentional dictation errors.

## 2023-05-02 ENCOUNTER — Ambulatory Visit (INDEPENDENT_AMBULATORY_CARE_PROVIDER_SITE_OTHER): Payer: Commercial Managed Care - PPO | Admitting: Internal Medicine

## 2023-05-02 VITALS — BP 130/69 | HR 79 | Ht 64.0 in | Wt 207.0 lb

## 2023-05-02 DIAGNOSIS — Z1331 Encounter for screening for depression: Secondary | ICD-10-CM | POA: Diagnosis not present

## 2023-05-02 DIAGNOSIS — E6609 Other obesity due to excess calories: Secondary | ICD-10-CM | POA: Diagnosis not present

## 2023-05-02 DIAGNOSIS — F419 Anxiety disorder, unspecified: Secondary | ICD-10-CM

## 2023-05-02 DIAGNOSIS — F32A Depression, unspecified: Secondary | ICD-10-CM

## 2023-05-02 DIAGNOSIS — Z0001 Encounter for general adult medical examination with abnormal findings: Secondary | ICD-10-CM

## 2023-05-02 DIAGNOSIS — Z6832 Body mass index (BMI) 32.0-32.9, adult: Secondary | ICD-10-CM

## 2023-05-02 MED ORDER — ESCITALOPRAM OXALATE 5 MG PO TABS
5.0000 mg | ORAL_TABLET | Freq: Every day | ORAL | 1 refills | Status: DC
Start: 2023-05-02 — End: 2023-06-17

## 2023-05-02 NOTE — Progress Notes (Signed)
Established Patient Office Visit  Subjective:  Patient ID: Kelsey Moran, female    DOB: 09/19/2001  Age: 22 y.o. MRN: [id number]  Chief Complaint  Patient presents with   Annual Exam    Physical    No new complaints, here for  CPE, lab review and medication refills. C/o depression and anxiety. C/o insomnia but drinks energy drinks and coffee heavily. Scored 27 on her depression screen.    No other concerns at this time.   No past medical history on file.  No past surgical history on file.  Social History   Socioeconomic History   Marital status: Single    Spouse name: Not on file   Number of children: Not on file   Years of education: Not on file   Highest education level: Not on file  Occupational History   Not on file  Tobacco Use   Smoking status: Every Day    Types: Cigarettes   Smokeless tobacco: Never  Substance and Sexual Activity   Alcohol use: Not on file   Drug use: Not on file   Sexual activity: Not on file  Other Topics Concern   Not on file  Social History Narrative   Not on file   Social Determinants of Health   Financial Resource Strain: Not on file  Food Insecurity: Not on file  Transportation Needs: Not on file  Physical Activity: Not on file  Stress: Not on file  Social Connections: Not on file  Intimate Partner Violence: Not on file    No family history on file.  Allergies  Allergen Reactions   Shellfish Allergy Anaphylaxis    Review of Systems  Constitutional:  Negative for weight loss.       No night sweats  Genitourinary: Negative.   Psychiatric/Behavioral:  The patient has insomnia.   All other systems reviewed and are negative.      Objective:   BP 130/69   Pulse 79   Ht 5\' 4"  (1.626 m)   Wt 207 lb (93.9 kg)   LMP 03/30/2023   SpO2 98%   BMI 35.53 kg/m   Vitals:   05/02/23 1116  BP: 130/69  Pulse: 79  Height: 5\' 4"  (1.626 m)  Weight: 207 lb (93.9 kg)  SpO2: 98%  BMI (Calculated): 35.51     Physical Exam Vitals reviewed.  Constitutional:      General: She is not in acute distress.    Appearance: She is obese.  HENT:     Head: Normocephalic.     Comments: Non tender left occipital lymphadenopathy    Nose: Nose normal.     Mouth/Throat:     Mouth: Mucous membranes are moist.  Eyes:     Extraocular Movements: Extraocular movements intact.     Pupils: Pupils are equal, round, and reactive to light.  Cardiovascular:     Rate and Rhythm: Normal rate and regular rhythm.     Heart sounds: No murmur heard. Pulmonary:     Effort: Pulmonary effort is normal.     Breath sounds: No rhonchi or rales.  Abdominal:     General: Abdomen is flat.     Palpations: There is no hepatomegaly, splenomegaly or mass.  Musculoskeletal:        General: Normal range of motion.     Cervical back: Normal range of motion. No tenderness.  Skin:    General: Skin is warm and dry.  Neurological:     General: No focal deficit present.  Mental Status: She is alert and oriented to person, place, and time.     Cranial Nerves: No cranial nerve deficit.     Motor: No weakness.  Psychiatric:        Mood and Affect: Mood normal.        Behavior: Behavior normal.      No results found for any visits on 05/02/23.  Recent Results (from the past 2160 hour(Jewel Mcafee))  Comprehensive metabolic panel     Status: Abnormal   Collection Time: 04/02/23  8:30 PM  Result Value Ref Range   Sodium 137 135 - 145 mmol/L   Potassium 3.9 3.5 - 5.1 mmol/L   Chloride 107 98 - 111 mmol/L   CO2 24 22 - 32 mmol/L   Glucose, Bld 88 70 - 99 mg/dL    Comment: Glucose reference range applies only to samples taken after fasting for at least 8 hours.   BUN 6 6 - 20 mg/dL   Creatinine, Ser 3.08 0.44 - 1.00 mg/dL   Calcium 9.1 8.9 - 65.7 mg/dL   Total Protein 8.7 (H) 6.5 - 8.1 g/dL   Albumin 4.1 3.5 - 5.0 g/dL   AST 26 15 - 41 U/L   ALT 15 0 - 44 U/L   Alkaline Phosphatase 57 38 - 126 U/L   Total Bilirubin 0.6 0.3 -  1.2 mg/dL   GFR, Estimated >84 >69 mL/min    Comment: (NOTE) Calculated using the CKD-EPI Creatinine Equation (2021)    Anion gap 6 5 - 15    Comment: Performed at [facility], [address] [place], [place] [address]  CBC with Differential     Status: None   Collection Time: 04/02/23  8:30 PM  Result Value Ref Range   WBC 7.3 4.0 - 10.5 K/uL   RBC 4.77 3.87 - 5.11 MIL/uL   Hemoglobin 12.4 12.0 - 15.0 g/dL   HCT 84.1 32.4 - 40.1 %   MCV 82.0 80.0 - 100.0 fL   MCH 26.0 26.0 - 34.0 pg   MCHC 31.7 30.0 - 36.0 g/dL   RDW 02.7 25.3 - 66.4 %   Platelets 303 150 - 400 K/uL   nRBC 0.0 0.0 - 0.2 %   Neutrophils Relative % 50 %   Neutro Abs 3.6 1.7 - 7.7 K/uL   Lymphocytes Relative 38 %   Lymphs Abs 2.8 0.7 - 4.0 K/uL   Monocytes Relative 8 %   Monocytes Absolute 0.6 0.1 - 1.0 K/uL   Eosinophils Relative 4 %   Eosinophils Absolute 0.3 0.0 - 0.5 K/uL   Basophils Relative 0 %   Basophils Absolute 0.0 0.0 - 0.1 K/uL   Immature Granulocytes 0 %   Abs Immature Granulocytes 0.01 0.00 - 0.07 K/uL    Comment: Performed at [facility], [address] [place], [place] [address]  Pregnancy, urine     Status: None   Collection Time: 04/02/23  8:30 PM  Result Value Ref Range   Preg Test, Ur NEGATIVE NEGATIVE    Comment: Performed at [facility], [address] [place], [place] [address]  Urinalysis, Routine w reflex microscopic -Urine, Clean Catch     Status: Abnormal   Collection Time: 04/02/23  8:30 PM  Result Value Ref Range   Color, Urine YELLOW (A) YELLOW   APPearance CLEAR (A) CLEAR   Specific Gravity, Urine 1.011 1.005 - 1.030   pH 6.0 5.0 - 8.0   Glucose, UA NEGATIVE NEGATIVE mg/dL  Hgb urine dipstick NEGATIVE NEGATIVE   Bilirubin Urine NEGATIVE NEGATIVE   Ketones, ur NEGATIVE NEGATIVE mg/dL   Protein, ur NEGATIVE NEGATIVE mg/dL   Nitrite NEGATIVE NEGATIVE   Leukocytes,Ua NEGATIVE NEGATIVE    Comment: Performed at [facility], [address] [place], [place] [address]  Sed Rate (ESR)     Status: Abnormal   Collection Time: 04/13/23 11:27 AM  Result Value Ref Range   Sed Rate 41 (H) 0 - 32 mm/hr  LDH     Status: None   Collection Time: 04/13/23 11:27 AM  Result Value Ref Range   LDH 160 119 - 226 IU/L  HIV antibody (with reflex)     Status: None   Collection Time: 04/13/23 11:38 AM  Result Value Ref Range   HIV Screen 4th Generation wRfx Non Reactive Non Reactive    Comment: HIV-1/HIV-2 antibodies and HIV-1 p24 antigen were NOT detected. There is no laboratory evidence of HIV infection. HIV Negative       Assessment & Plan:  As per problem list  Problem List Items Addressed This Visit       Other   Obesity   Anxiety and depression - Primary    Return in about 6 weeks (around 06/13/2023) for Mood follow up.   Total time spent: 35 minutes  Luna Fuse, MD  05/02/2023   This document may have been prepared by [facility] Voice Recognition software and as such may include unintentional dictation errors.

## 2023-05-03 LAB — LIPID PANEL
Chol/HDL Ratio: 4.4 ratio (ref 0.0–4.4)
Cholesterol, Total: 224 mg/dL — ABNORMAL HIGH (ref 100–199)
HDL: 51 mg/dL (ref >39)
LDL Chol Calc (NIH): 155 mg/dL — ABNORMAL HIGH (ref 0–99)
Triglycerides: 102 mg/dL (ref 0–149)
VLDL Cholesterol Cal: 18 mg/dL (ref 5–40)

## 2023-05-03 NOTE — Progress Notes (Signed)
Spoke with patient, verified DOB; patient verbalized understanding PCP recommendations.

## 2023-06-14 ENCOUNTER — Inpatient Hospital Stay
Admission: RE | Admit: 2023-06-14 | Discharge: 2023-06-14 | Disposition: A | Discharge status: Home / Self Care | Payer: Self-pay | Source: Ambulatory Visit | Attending: Unknown Physician Specialty | Admitting: Unknown Physician Specialty

## 2023-06-14 ENCOUNTER — Other Ambulatory Visit: Payer: Self-pay | Admitting: Unknown Physician Specialty

## 2023-06-14 DIAGNOSIS — K118 Other diseases of salivary glands: Secondary | ICD-10-CM

## 2023-06-16 ENCOUNTER — Other Ambulatory Visit: Payer: Self-pay | Admitting: Unknown Physician Specialty

## 2023-06-16 DIAGNOSIS — K118 Other diseases of salivary glands: Secondary | ICD-10-CM

## 2023-06-17 ENCOUNTER — Encounter: Payer: Self-pay | Admitting: Internal Medicine

## 2023-06-17 ENCOUNTER — Ambulatory Visit: Payer: Commercial Managed Care - PPO | Admitting: Internal Medicine

## 2023-06-17 VITALS — BP 118/68 | Ht 64.0 in | Wt 205.8 lb

## 2023-06-17 DIAGNOSIS — Z6832 Body mass index (BMI) 32.0-32.9, adult: Secondary | ICD-10-CM

## 2023-06-17 DIAGNOSIS — F32A Depression, unspecified: Secondary | ICD-10-CM

## 2023-06-17 DIAGNOSIS — L509 Urticaria, unspecified: Secondary | ICD-10-CM | POA: Diagnosis not present

## 2023-06-17 DIAGNOSIS — R42 Dizziness and giddiness: Secondary | ICD-10-CM

## 2023-06-17 DIAGNOSIS — E6609 Other obesity due to excess calories: Secondary | ICD-10-CM | POA: Diagnosis not present

## 2023-06-17 DIAGNOSIS — F419 Anxiety disorder, unspecified: Secondary | ICD-10-CM | POA: Diagnosis not present

## 2023-06-17 DIAGNOSIS — I951 Orthostatic hypotension: Secondary | ICD-10-CM

## 2023-06-17 MED ORDER — PREDNISONE 20 MG PO TABS
40.0000 mg | ORAL_TABLET | Freq: Every day | ORAL | 0 refills | Status: AC
Start: 2023-06-17 — End: 2023-06-22

## 2023-06-17 MED ORDER — ESCITALOPRAM OXALATE 10 MG PO TABS
10.0000 mg | ORAL_TABLET | Freq: Every day | ORAL | 2 refills | Status: DC
Start: 2023-06-17 — End: 2023-07-15

## 2023-06-17 NOTE — Progress Notes (Signed)
Established Patient Office Visit  Subjective:  Patient ID: Kelsey Moran, female    DOB: [birth year]  Age: 22 y.o. MRN: [id number]  Chief Complaint  Patient presents with   Follow-up    No new complaints except postural dizziness, here for lab review and medication refills. Evaluated by ENT who are yet to schedule a biopsy. C/o persistent rash after being diagnosed with urticaria at [employer]. Also c/o that mood has failed to improve on current dose of Lexapro.    No other concerns at this time.   No past medical history on file.  No past surgical history on file.  Social History   Socioeconomic History   Marital status: Single    Spouse name: Not on file   Number of children: Not on file   Years of education: Not on file   Highest education level: Not on file  Occupational History   Not on file  Tobacco Use   Smoking status: Every Day    Types: Cigarettes   Smokeless tobacco: Never  Substance and Sexual Activity   Alcohol use: Not on file   Drug use: Not on file   Sexual activity: Not on file  Other Topics Concern   Not on file  Social History Narrative   Not on file   Social Determinants of Health   Financial Resource Strain: Not on file  Food Insecurity: Not on file  Transportation Needs: Not on file  Physical Activity: Not on file  Stress: Not on file  Social Connections: Not on file  Intimate Partner Violence: Not on file    No family history on file.  Allergies  Allergen Reactions   Shellfish Allergy Anaphylaxis    Review of Systems  Constitutional:  Negative for weight loss.       No night sweats  Genitourinary: Negative.   Skin:  Positive for rash.  Psychiatric/Behavioral:  The patient has insomnia.   All other systems reviewed and are negative.      Objective:   BP 118/68   Ht 5\' 4"  (1.626 m)   Wt 205 lb 12.8 oz (93.4 kg) Comment: 210.8 lbs with walking boot  BMI 35.33 kg/m   Vitals:   06/17/23 1053  BP: 118/68  Pulse:  Comment: could not get pulse due to patients nails are very long  Height: 5\' 4"  (1.626 m)  Weight: 205 lb 12.8 oz (93.4 kg) Comment: 210.8 lbs with walking boot  SpO2: Comment: could not get O2 due to patients nails are very long  BMI (Calculated): 35.31    Physical Exam Vitals reviewed.  Constitutional:      General: She is not in acute distress.    Appearance: She is obese.  HENT:     Head: Normocephalic.     Comments: Non tender left occipital lymphadenopathy    Nose: Nose normal.     Mouth/Throat:     Mouth: Mucous membranes are moist.  Eyes:     Extraocular Movements: Extraocular movements intact.     Pupils: Pupils are equal, round, and reactive to light.  Cardiovascular:     Rate and Rhythm: Normal rate and regular rhythm.     Heart sounds: No murmur heard. Pulmonary:     Effort: Pulmonary effort is normal.     Breath sounds: No rhonchi or rales.  Abdominal:     General: Abdomen is flat.     Palpations: There is no hepatomegaly, splenomegaly or mass.  Musculoskeletal:  General: Normal range of motion.     Cervical back: Normal range of motion. No tenderness.  Skin:    General: Skin is warm and dry.     Findings: Rash (fading, bles) present.  Neurological:     General: No focal deficit present.     Mental Status: She is alert and oriented to person, place, and time.     Cranial Nerves: No cranial nerve deficit.     Motor: No weakness.  Psychiatric:        Mood and Affect: Mood normal.        Behavior: Behavior normal.      No results found for any visits on 06/17/23.      Assessment & Plan:  As per problem list. Increase Escitalopram. Problem List Items Addressed This Visit       Cardiovascular and Mediastinum   Orthostasis     Other   Obesity   Anxiety and depression   Relevant Medications   escitalopram (LEXAPRO) 10 MG tablet   Other Visit Diagnoses     Urticaria    -  Primary   Relevant Medications   predniSONE (DELTASONE) 20 MG  tablet   Dizziness           Return in about 2 months (around 08/17/2023).   Total time spent: 30 minutes  Luna Fuse, MD  06/17/2023   This document may have been prepared by [facility] Voice Recognition software and as such may include unintentional dictation errors.

## 2023-06-20 ENCOUNTER — Other Ambulatory Visit: Payer: Self-pay | Admitting: Unknown Physician Specialty

## 2023-06-20 DIAGNOSIS — K118 Other diseases of salivary glands: Secondary | ICD-10-CM

## 2023-06-20 NOTE — Progress Notes (Signed)
Gilmer Mor, DO sent to [facility] for US guided biopsy of left parotid nodule.  CT 8/15.  [address]       Previous Messages    ----- Message ----- From: [facility] Sent: 06/17/2023   3:05 PM EDT To: [facility] Procedure Requests Subject: US Biopsy                                      IR Approval Request:   Procedure:   US guided core LT Parotid mass Biopsy  Reason:       2 cm LT tail parotid mass  History:        OUTSIDE IMAGES  - CT Head / Face  04/19/23      Provider:      Dr Linus Salmons, MD    Provider Contact #    [facility]  [phone]

## 2023-06-22 ENCOUNTER — Telehealth: Payer: Self-pay | Admitting: General Practice

## 2023-06-22 NOTE — Telephone Encounter (Signed)
Pt called wanting to know if her FMLA paperwork had been filled out & is ready?

## 2023-06-23 ENCOUNTER — Other Ambulatory Visit: Payer: Self-pay

## 2023-06-26 ENCOUNTER — Other Ambulatory Visit: Payer: Self-pay | Admitting: Internal Medicine

## 2023-06-26 DIAGNOSIS — F419 Anxiety disorder, unspecified: Secondary | ICD-10-CM

## 2023-06-28 ENCOUNTER — Ambulatory Visit
Admission: RE | Admit: 2023-06-28 | Discharge: 2023-06-28 | Disposition: A | Discharge status: Home / Self Care | Payer: Commercial Managed Care - PPO | Source: Ambulatory Visit | Attending: Unknown Physician Specialty | Admitting: Unknown Physician Specialty

## 2023-06-28 ENCOUNTER — Telehealth: Payer: Self-pay

## 2023-06-28 DIAGNOSIS — K118 Other diseases of salivary glands: Secondary | ICD-10-CM | POA: Diagnosis not present

## 2023-06-28 DIAGNOSIS — R590 Localized enlarged lymph nodes: Secondary | ICD-10-CM | POA: Diagnosis not present

## 2023-06-28 MED ORDER — LIDOCAINE HCL (PF) 1 % IJ SOLN
10.0000 mL | Freq: Once | INTRAMUSCULAR | Status: AC
  Administered 2023-06-28: 10 mL via SUBCUTANEOUS
  Filled 2023-06-28: qty 10

## 2023-06-28 NOTE — Telephone Encounter (Signed)
Need to call the patient and find out why is she requesting disability and what period of coverage is she requesting?

## 2023-06-28 NOTE — Discharge Instructions (Signed)
Instructions discussed with patient. PCS 

## 2023-07-07 ENCOUNTER — Telehealth: Payer: Self-pay | Admitting: Internal Medicine

## 2023-07-07 NOTE — Telephone Encounter (Signed)
Patient left VM to check the status of her FMLA paperwork.

## 2023-07-07 NOTE — Telephone Encounter (Signed)
Patient left VM for a call back

## 2023-07-08 NOTE — Telephone Encounter (Signed)
Once insurance is finished reviewing over paperwork will reach out to pt.

## 2023-07-15 ENCOUNTER — Other Ambulatory Visit: Payer: Self-pay

## 2023-07-15 DIAGNOSIS — F32A Depression, unspecified: Secondary | ICD-10-CM

## 2023-07-15 MED ORDER — ESCITALOPRAM OXALATE 10 MG PO TABS: 10.0000 mg | ORAL_TABLET | Freq: Every day | ORAL | 1 refills | Status: DC

## 2023-08-17 ENCOUNTER — Ambulatory Visit: Payer: Commercial Managed Care - PPO | Admitting: Internal Medicine

## 2023-09-02 ENCOUNTER — Ambulatory Visit: Payer: Commercial Managed Care - PPO | Admitting: Internal Medicine

## 2023-10-02 ENCOUNTER — Other Ambulatory Visit: Payer: Self-pay | Admitting: Internal Medicine

## 2023-10-02 DIAGNOSIS — F419 Anxiety disorder, unspecified: Secondary | ICD-10-CM

## 2023-11-09 ENCOUNTER — Encounter: Payer: Self-pay | Admitting: Internal Medicine

## 2023-11-09 ENCOUNTER — Ambulatory Visit: Payer: Commercial Managed Care - PPO | Admitting: Internal Medicine

## 2023-11-09 VITALS — BP 112/78 | HR 83 | Temp 98.9°F | Ht 64.0 in | Wt 227.2 lb

## 2023-11-09 DIAGNOSIS — E66812 Obesity, class 2: Secondary | ICD-10-CM | POA: Diagnosis not present

## 2023-11-09 DIAGNOSIS — R590 Localized enlarged lymph nodes: Secondary | ICD-10-CM

## 2023-11-09 DIAGNOSIS — E6609 Other obesity due to excess calories: Secondary | ICD-10-CM | POA: Diagnosis not present

## 2023-11-09 DIAGNOSIS — F419 Anxiety disorder, unspecified: Secondary | ICD-10-CM | POA: Diagnosis not present

## 2023-11-09 DIAGNOSIS — Z6839 Body mass index (BMI) 39.0-39.9, adult: Secondary | ICD-10-CM

## 2023-11-09 DIAGNOSIS — F32A Depression, unspecified: Secondary | ICD-10-CM

## 2023-11-09 MED ORDER — WEGOVY 0.25 MG/0.5ML ~~LOC~~ SOAJ: 0.2500 mg | SUBCUTANEOUS | 0 refills | Status: AC

## 2023-11-09 MED ORDER — ESCITALOPRAM OXALATE 20 MG PO TABS: 20.0000 mg | ORAL_TABLET | Freq: Every day | ORAL | 1 refills | Status: DC

## 2023-11-09 NOTE — Progress Notes (Signed)
 Established Patient Office Visit  Subjective:  Patient ID: Kelsey Moran, female    DOB: 2001/05/26  Age: 23 y.o. MRN: [id number]  Chief Complaint  Patient presents with   Follow-up    6 month follow up, discuss medications.    No new complaints, mood is stable on current dose of antidepressant.    No other concerns at this time.   No past medical history on file.  No past surgical history on file.  Social History   Socioeconomic History   Marital status: Single    Spouse name: Not on file   Number of children: Not on file   Years of education: Not on file   Highest education level: Not on file  Occupational History   Not on file  Tobacco Use   Smoking status: Every Day    Types: Cigarettes   Smokeless tobacco: Never  Substance and Sexual Activity   Alcohol use: Not on file   Drug use: Not on file   Sexual activity: Not on file  Other Topics Concern   Not on file  Social History Narrative   Not on file   Social Drivers of Health   Financial Resource Strain: Not on file  Food Insecurity: Not on file  Transportation Needs: Not on file  Physical Activity: Not on file  Stress: Not on file  Social Connections: Not on file  Intimate Partner Violence: Not on file    No family history on file.  Allergies  Allergen Reactions   Shellfish Allergy Anaphylaxis    Outpatient Medications Prior to Visit  Medication Sig   [DISCONTINUED] escitalopram  (LEXAPRO ) 10 MG tablet TAKE 1 TABLET BY MOUTH EVERY DAY   [DISCONTINUED] cephALEXin  (KEFLEX ) 500 MG capsule Take 1 capsule (500 mg total) by mouth 3 (three) times daily. (Patient not taking: Reported on 11/09/2023)   [DISCONTINUED] ondansetron  (ZOFRAN  ODT) 4 MG disintegrating tablet Take 1 tablet (4 mg total) by mouth every 8 (eight) hours as needed for nausea or vomiting. (Patient not taking: Reported on 11/09/2023)   [DISCONTINUED] triamcinolone cream (KENALOG) 0.1 % PLEASE SEE ATTACHED FOR DETAILED DIRECTIONS  (Patient not taking: Reported on 11/09/2023)   No facility-administered medications prior to visit.    Review of Systems  Constitutional:  Negative for weight loss.       No night sweats  Genitourinary: Negative.   Skin:  Positive for rash.  Psychiatric/Behavioral:  The patient has insomnia.   All other systems reviewed and are negative.      Objective:   BP 112/78   Pulse 83   Temp 98.9 F (37.2 C) (Tympanic)   Ht 5' 4 (1.626 m)   Wt 227 lb 3.2 oz (103.1 kg)   SpO2 100%   BMI 39.00 kg/m   Vitals:   11/09/23 0953  BP: 112/78  Pulse: 83  Temp: 98.9 F (37.2 C)  Height: 5' 4 (1.626 m)  Weight: 227 lb 3.2 oz (103.1 kg)  SpO2: 100%  TempSrc: Tympanic  BMI (Calculated): 38.98    Physical Exam Constitutional:      Appearance: She is obese.      No results found for any visits on 11/09/23.  No results found for this or any previous visit (from the past 2160 hours).    Assessment & Plan:  As per problem list. Stricter low calorie diet, low cholesterol and low fat diet and exercise as much as possible.   Problem List Items Addressed This Visit  Immune and Lymphatic   Lymphadenopathy of head and neck region     Other   Obesity - Primary   Relevant Medications   Semaglutide -Weight Management (WEGOVY ) 0.25 MG/0.5ML SOAJ   Other Relevant Orders   CBC With Diff/Platelet   Lipid panel   Comprehensive metabolic panel   Anxiety and depression   Relevant Medications   escitalopram  (LEXAPRO ) 20 MG tablet    Return in about 6 months (around 05/08/2024) for fu with labs prior.   Total time spent: 20 minutes  Sherrill Cinderella Perry, MD  11/09/2023   This document may have been prepared by [facility] Voice Recognition software and as such may include unintentional dictation errors.

## 2023-11-11 ENCOUNTER — Other Ambulatory Visit: Payer: Self-pay | Admitting: Internal Medicine

## 2023-11-11 DIAGNOSIS — F419 Anxiety disorder, unspecified: Secondary | ICD-10-CM

## 2023-12-12 ENCOUNTER — Ambulatory Visit: Payer: Commercial Managed Care - PPO | Admitting: Internal Medicine

## 2024-04-09 ENCOUNTER — Other Ambulatory Visit: Payer: Self-pay | Admitting: Internal Medicine

## 2024-04-09 DIAGNOSIS — N912 Amenorrhea, unspecified: Secondary | ICD-10-CM

## 2024-04-11 ENCOUNTER — Other Ambulatory Visit

## 2024-04-12 LAB — HCG, SERUM, QUALITATIVE: hCG,Beta Subunit,Qual,Serum: NEGATIVE m[IU]/mL (ref <6)

## 2024-04-13 ENCOUNTER — Ambulatory Visit: Payer: Self-pay | Admitting: Internal Medicine

## 2024-05-09 ENCOUNTER — Encounter: Payer: Self-pay | Admitting: Internal Medicine

## 2024-05-09 ENCOUNTER — Ambulatory Visit (INDEPENDENT_AMBULATORY_CARE_PROVIDER_SITE_OTHER): Payer: Commercial Managed Care - PPO | Admitting: Internal Medicine

## 2024-05-09 VITALS — BP 121/87 | HR 70 | Temp 98.2°F | Ht 64.0 in | Wt 252.4 lb

## 2024-05-09 DIAGNOSIS — L509 Urticaria, unspecified: Secondary | ICD-10-CM

## 2024-05-09 DIAGNOSIS — Z0130 Encounter for examination of blood pressure without abnormal findings: Secondary | ICD-10-CM

## 2024-05-09 DIAGNOSIS — Z6841 Body Mass Index (BMI) 40.0 and over, adult: Secondary | ICD-10-CM | POA: Diagnosis not present

## 2024-05-09 DIAGNOSIS — E66813 Obesity, class 3: Secondary | ICD-10-CM

## 2024-05-09 MED ORDER — PHENDIMETRAZINE TARTRATE ER 105 MG PO CP24: 1.0000 | ORAL_CAPSULE | Freq: Every day | ORAL | 0 refills | Status: DC

## 2024-05-09 NOTE — Progress Notes (Signed)
 Established Patient Office Visit  Subjective:  Patient ID: Kelsey Moran, female    DOB: [birth year]  Age: 23 y.o. MRN: [id number]  Chief Complaint  Patient presents with   Follow-up    6 month lab results    C/o recurrent episodes of hives relieved by fexofenadine as needed. Denies any known exacerbating factors except emotional stress and no known food allergies. Gained 25 lbs in the last 6 months while mood has been very stable on Lexapro .    No other concerns at this time.   No past medical history on file.  No past surgical history on file.  Social History   Socioeconomic History   Marital status: Single    Spouse name: Not on file   Number of children: Not on file   Years of education: Not on file   Highest education level: Not on file  Occupational History   Not on file  Tobacco Use   Smoking status: Every Day    Types: Cigarettes   Smokeless tobacco: Never  Substance and Sexual Activity   Alcohol use: Not on file   Drug use: Not on file   Sexual activity: Not on file  Other Topics Concern   Not on file  Social History Narrative   Not on file   Social Drivers of Health   Financial Resource Strain: Not on file  Food Insecurity: Not on file  Transportation Needs: Not on file  Physical Activity: Not on file  Stress: Not on file  Social Connections: Not on file  Intimate Partner Violence: Not on file    No family history on file.  Allergies  Allergen Reactions   Shellfish Allergy Anaphylaxis    Outpatient Medications Prior to Visit  Medication Sig   escitalopram  (LEXAPRO ) 20 MG tablet Take 1 tablet (20 mg total) by mouth daily.   fexofenadine (ALLEGRA) 180 MG tablet Take 180 mg by mouth daily.   No facility-administered medications prior to visit.    Review of Systems  Constitutional:  Negative for weight loss (gained 25 lbs).       No night sweats  Genitourinary: Negative.   Skin:  Positive for rash.  Psychiatric/Behavioral:  The  patient has insomnia.   All other systems reviewed and are negative.      Objective:   BP 121/87   Pulse 70   Temp 98.2 F (36.8 C)   Ht 5' 4 (1.626 m)   Wt 252 lb 6.4 oz (114.5 kg)   SpO2 98%   BMI 43.32 kg/m   Vitals:   05/09/24 1004  BP: 121/87  Pulse: 70  Temp: 98.2 F (36.8 C)  Height: 5' 4 (1.626 m)  Weight: 252 lb 6.4 oz (114.5 kg)  SpO2: 98%  BMI (Calculated): 43.3    Physical Exam Vitals reviewed.  Constitutional:      General: She is not in acute distress.    Appearance: She is obese.  HENT:     Head: Normocephalic.     Comments: Non tender left occipital lymphadenopathy    Nose: Nose normal.     Mouth/Throat:     Mouth: Mucous membranes are moist.  Eyes:     Extraocular Movements: Extraocular movements intact.     Pupils: Pupils are equal, round, and reactive to light.  Cardiovascular:     Rate and Rhythm: Normal rate and regular rhythm.     Heart sounds: No murmur heard. Pulmonary:     Effort: Pulmonary effort is normal.  Breath sounds: No rhonchi or rales.  Abdominal:     General: Abdomen is flat.     Palpations: There is no hepatomegaly, splenomegaly or mass.  Musculoskeletal:        General: Normal range of motion.     Cervical back: Normal range of motion. No tenderness.  Skin:    General: Skin is warm and dry.     Findings: Rash (fading, bles) present.  Neurological:     General: No focal deficit present.     Mental Status: She is alert and oriented to person, place, and time.     Cranial Nerves: No cranial nerve deficit.     Motor: No weakness.  Psychiatric:        Mood and Affect: Mood normal.        Behavior: Behavior normal.      No results found for any visits on 05/09/24.  Recent Results (from the past 2160 hours)  Pregnancy Test, Serum, Qual     Status: None   Collection Time: 04/11/24  8:59 AM  Result Value Ref Range   hCG,Beta Subunit,Qual,Serum Negative Negative <6 mIU/mL      Assessment & Plan:  As per  problem list  Problem List Items Addressed This Visit       Other   Obesity   Relevant Medications   Phendimetrazine  Tartrate 105 MG CP24   Other Visit Diagnoses       Urticaria    -  Primary       Return in about 4 weeks (around 06/06/2024) for fu with labs prior.   Total time spent: 20 minutes  Sherrill Cinderella Perry, MD  05/09/2024   This document may have been prepared by [facility] Voice Recognition software and as such may include unintentional dictation errors.

## 2024-05-13 ENCOUNTER — Other Ambulatory Visit: Payer: Self-pay | Admitting: Internal Medicine

## 2024-05-13 DIAGNOSIS — F419 Anxiety disorder, unspecified: Secondary | ICD-10-CM

## 2024-06-13 ENCOUNTER — Encounter: Payer: Self-pay | Admitting: Internal Medicine

## 2024-06-13 ENCOUNTER — Ambulatory Visit: Admitting: Internal Medicine

## 2024-06-13 VITALS — BP 118/72 | Temp 97.6°F | Ht 64.0 in | Wt 254.4 lb

## 2024-06-13 DIAGNOSIS — Z0130 Encounter for examination of blood pressure without abnormal findings: Secondary | ICD-10-CM

## 2024-06-13 DIAGNOSIS — E66813 Obesity, class 3: Secondary | ICD-10-CM | POA: Diagnosis not present

## 2024-06-13 DIAGNOSIS — Z6841 Body Mass Index (BMI) 40.0 and over, adult: Secondary | ICD-10-CM

## 2024-06-13 NOTE — Progress Notes (Signed)
 Established Patient Office Visit  Subjective:  Patient ID: Kelsey Moran, female    DOB: 2001-07-03  Age: 23 y.o. MRN: [id number]  Chief Complaint  Patient presents with   Follow-up    1 mo lab results    No new complaints, here for lab review and medication refills. Continues to gain weight but didn't fill her phendimetrazine  prescription.    No other concerns at this time.   No past medical history on file.  No past surgical history on file.  Social History   Socioeconomic History   Marital status: Single    Spouse name: Not on file   Number of children: Not on file   Years of education: Not on file   Highest education level: Not on file  Occupational History   Not on file  Tobacco Use   Smoking status: Every Day    Types: Cigarettes   Smokeless tobacco: Never  Substance and Sexual Activity   Alcohol use: Not on file   Drug use: Not on file   Sexual activity: Not on file  Other Topics Concern   Not on file  Social History Narrative   Not on file   Social Drivers of Health   Financial Resource Strain: Not on file  Food Insecurity: Not on file  Transportation Needs: Not on file  Physical Activity: Not on file  Stress: Not on file  Social Connections: Not on file  Intimate Partner Violence: Not on file    No family history on file.  No Active Allergies  Outpatient Medications Prior to Visit  Medication Sig   escitalopram  (LEXAPRO ) 20 MG tablet TAKE 1 TABLET BY MOUTH EVERY DAY   fexofenadine (ALLEGRA) 180 MG tablet Take 180 mg by mouth daily.   Phendimetrazine  Tartrate 105 MG CP24 Take 1 capsule (105 mg total) by mouth daily in the afternoon. (Patient not taking: Reported on 06/13/2024)   No facility-administered medications prior to visit.    Review of Systems  Constitutional:  Negative for weight loss.       No night sweats  Genitourinary: Negative.   Skin:  Positive for rash.  Psychiatric/Behavioral:  The patient has insomnia.   All  other systems reviewed and are negative.      Objective:   BP 118/72   Temp 97.6 F (36.4 C)   Ht 5' 4 (1.626 m)   Wt 254 lb 6.4 oz (115.4 kg)   BMI 43.67 kg/m   Vitals:   06/13/24 1115  BP: 118/72  Temp: 97.6 F (36.4 C)  Height: 5' 4 (1.626 m)  Weight: 254 lb 6.4 oz (115.4 kg)  BMI (Calculated): 43.65    Physical Exam Vitals reviewed.  Constitutional:      General: She is not in acute distress.    Appearance: She is obese.  HENT:     Head: Normocephalic.     Comments: Non tender left occipital lymphadenopathy    Nose: Nose normal.     Mouth/Throat:     Mouth: Mucous membranes are moist.  Eyes:     Extraocular Movements: Extraocular movements intact.     Pupils: Pupils are equal, round, and reactive to light.  Cardiovascular:     Rate and Rhythm: Normal rate and regular rhythm.     Heart sounds: No murmur heard. Pulmonary:     Effort: Pulmonary effort is normal.     Breath sounds: No rhonchi or rales.  Abdominal:     General: Abdomen is flat.  Palpations: There is no hepatomegaly, splenomegaly or mass.  Musculoskeletal:        General: Normal range of motion.     Cervical back: Normal range of motion. No tenderness.  Skin:    General: Skin is warm and dry.     Findings: Rash (fading, bles) present.  Neurological:     General: No focal deficit present.     Mental Status: She is alert and oriented to person, place, and time.     Cranial Nerves: No cranial nerve deficit.     Motor: No weakness.  Psychiatric:        Mood and Affect: Mood normal.        Behavior: Behavior normal.      No results found for any visits on 06/13/24.  Recent Results (from the past 2160 hours)  Pregnancy Test, Serum, Qual     Status: None   Collection Time: 04/11/24  8:59 AM  Result Value Ref Range   hCG,Beta Subunit,Qual,Serum Negative Negative <6 mIU/mL      Assessment & Plan:  As per problem list. Fill Rx, stricter low calorie diet, low cholesterol and low  fat diet and exercise as much as possible.  Problem List Items Addressed This Visit       Other   Obesity - Primary    Return in about 1 month (around 07/14/2024) for Weight management.   Total time spent: 20 minutes  Sherrill Cinderella Perry, MD  06/13/2024   This document may have been prepared by [facility] Voice Recognition software and as such may include unintentional dictation errors.

## 2024-07-18 ENCOUNTER — Ambulatory Visit (INDEPENDENT_AMBULATORY_CARE_PROVIDER_SITE_OTHER): Admitting: Internal Medicine

## 2024-07-18 DIAGNOSIS — E66813 Obesity, class 3: Secondary | ICD-10-CM | POA: Diagnosis not present

## 2024-07-18 DIAGNOSIS — Z0130 Encounter for examination of blood pressure without abnormal findings: Secondary | ICD-10-CM

## 2024-07-18 DIAGNOSIS — Z6841 Body Mass Index (BMI) 40.0 and over, adult: Secondary | ICD-10-CM

## 2024-07-18 MED ORDER — PHENDIMETRAZINE TARTRATE ER 105 MG PO CP24: 1.0000 | ORAL_CAPSULE | Freq: Every day | ORAL | 0 refills | Status: AC

## 2024-07-18 NOTE — Progress Notes (Signed)
 Established Patient Office Visit  Subjective:  Patient ID: Kelsey Moran, female    DOB: January 26, 2001  Age: 23 y.o. MRN: [id number]  Chief Complaint  Patient presents with   Follow-up    1 month follow up    No new complaints, here for weight follow up and medication refills. Did not fill Bontril  rx due to cost.     No other concerns at this time.   No past medical history on file.  No past surgical history on file.  Social History   Socioeconomic History   Marital status: Single    Spouse name: Not on file   Number of children: Not on file   Years of education: Not on file   Highest education level: Not on file  Occupational History   Not on file  Tobacco Use   Smoking status: Every Day    Types: Cigarettes   Smokeless tobacco: Never  Substance and Sexual Activity   Alcohol use: Not on file   Drug use: Not on file   Sexual activity: Not on file  Other Topics Concern   Not on file  Social History Narrative   Not on file   Social Drivers of Health   Financial Resource Strain: Not on file  Food Insecurity: Not on file  Transportation Needs: Not on file  Physical Activity: Not on file  Stress: Not on file  Social Connections: Not on file  Intimate Partner Violence: Not on file    No family history on file.  No Active Allergies  Outpatient Medications Prior to Visit  Medication Sig   escitalopram  (LEXAPRO ) 20 MG tablet TAKE 1 TABLET BY MOUTH EVERY DAY   fexofenadine (ALLEGRA) 180 MG tablet Take 180 mg by mouth daily.   [DISCONTINUED] Phendimetrazine  Tartrate 105 MG CP24 Take 1 capsule (105 mg total) by mouth daily in the afternoon. (Patient not taking: Reported on 06/13/2024)   No facility-administered medications prior to visit.    Review of Systems  Constitutional:  Negative for weight loss.       No night sweats  Genitourinary: Negative.   Skin:  Positive for rash.  Psychiatric/Behavioral:  The patient has insomnia.   All other systems  reviewed and are negative.      Objective:   BP 124/82   Pulse 82   Ht 5' 4 (1.626 m)   Wt 255 lb (115.7 kg)   SpO2 97%   BMI 43.77 kg/m   Vitals:   07/18/24 1018  BP: 124/82  Pulse: 82  Height: 5' 4 (1.626 m)  Weight: 255 lb (115.7 kg)  SpO2: 97%  BMI (Calculated): 43.75    Physical Exam Vitals reviewed.  Constitutional:      General: She is not in acute distress.    Appearance: She is obese.  HENT:     Head: Normocephalic.     Comments: Non tender left occipital lymphadenopathy    Nose: Nose normal.     Mouth/Throat:     Mouth: Mucous membranes are moist.  Eyes:     Extraocular Movements: Extraocular movements intact.     Pupils: Pupils are equal, round, and reactive to light.  Cardiovascular:     Rate and Rhythm: Normal rate and regular rhythm.     Heart sounds: No murmur heard. Pulmonary:     Effort: Pulmonary effort is normal.     Breath sounds: No rhonchi or rales.  Abdominal:     General: Abdomen is flat.  Palpations: There is no hepatomegaly, splenomegaly or mass.  Musculoskeletal:        General: Normal range of motion.     Cervical back: Normal range of motion. No tenderness.  Skin:    General: Skin is warm and dry.     Findings: Rash (fading, bles) present.  Neurological:     General: No focal deficit present.     Mental Status: She is alert and oriented to person, place, and time.     Cranial Nerves: No cranial nerve deficit.     Motor: No weakness.  Psychiatric:        Mood and Affect: Mood normal.        Behavior: Behavior normal.      No results found for any visits on 07/18/24.  No results found for this or any previous visit (from the past 2160 hours).    Assessment & Plan:  As per problem list. Reordered Bontril  with [employer] discount. Problem List Items Addressed This Visit       Other   Obesity   Relevant Medications   Phendimetrazine  Tartrate 105 MG CP24    Return in about 1 month (around 08/17/2024) for Weight  management.   Total time spent: 20 minutes  Sherrill Cinderella Perry, MD  07/18/2024   This document may have been prepared by [facility] Voice Recognition software and as such may include unintentional dictation errors.

## 2024-08-17 ENCOUNTER — Ambulatory Visit: Admitting: Internal Medicine

## 2024-08-21 ENCOUNTER — Ambulatory Visit: Admitting: Internal Medicine

## 2024-09-19 ENCOUNTER — Ambulatory Visit: Admitting: Internal Medicine

## 2024-10-03 ENCOUNTER — Ambulatory Visit: Admitting: Internal Medicine

## 2024-10-24 ENCOUNTER — Ambulatory Visit: Admitting: Internal Medicine
